# Patient Record
Sex: Male | Born: 1935 | Race: White | Hispanic: No
Health system: Southern US, Community
[De-identification: ages and names within clinical notes are randomized; demographics above are authoritative.]

---

## 2004-08-15 ENCOUNTER — Other Ambulatory Visit: Payer: Self-pay

## 2004-08-25 ENCOUNTER — Other Ambulatory Visit: Payer: Self-pay

## 2004-09-27 ENCOUNTER — Encounter: Payer: Self-pay | Admitting: Internal Medicine

## 2005-12-02 ENCOUNTER — Ambulatory Visit: Payer: Self-pay | Admitting: Gastroenterology

## 2006-07-24 ENCOUNTER — Inpatient Hospital Stay: Payer: Self-pay | Admitting: Internal Medicine

## 2006-07-24 ENCOUNTER — Other Ambulatory Visit: Payer: Self-pay

## 2006-09-17 ENCOUNTER — Ambulatory Visit: Payer: Self-pay | Admitting: Internal Medicine

## 2006-09-29 ENCOUNTER — Ambulatory Visit: Payer: Self-pay | Admitting: Internal Medicine

## 2009-05-16 ENCOUNTER — Ambulatory Visit: Payer: Self-pay | Admitting: Internal Medicine

## 2010-08-14 ENCOUNTER — Ambulatory Visit: Payer: Self-pay | Admitting: Gastroenterology

## 2012-01-07 ENCOUNTER — Ambulatory Visit: Payer: Self-pay | Admitting: Internal Medicine

## 2012-01-20 ENCOUNTER — Ambulatory Visit: Payer: Self-pay | Admitting: Internal Medicine

## 2012-01-29 ENCOUNTER — Ambulatory Visit: Payer: Self-pay | Admitting: Internal Medicine

## 2012-02-02 ENCOUNTER — Ambulatory Visit: Payer: Self-pay | Admitting: Internal Medicine

## 2012-02-26 ENCOUNTER — Ambulatory Visit: Payer: Self-pay | Admitting: Internal Medicine

## 2012-02-29 ENCOUNTER — Ambulatory Visit: Payer: Self-pay | Admitting: Gastroenterology

## 2012-02-29 ENCOUNTER — Emergency Department: Payer: Self-pay | Admitting: *Deleted

## 2012-02-29 LAB — CBC
HCT: 37.5 % — ABNORMAL LOW (ref 40.0–52.0)
HGB: 12.2 g/dL — ABNORMAL LOW (ref 13.0–18.0)
MCHC: 32.4 g/dL (ref 32.0–36.0)
MCV: 91 fL (ref 80–100)
Platelet: 199 10*3/uL (ref 150–440)
RBC: 4.14 10*6/uL — ABNORMAL LOW (ref 4.40–5.90)
RDW: 16.8 % — ABNORMAL HIGH (ref 11.5–14.5)
WBC: 4.8 10*3/uL (ref 3.8–10.6)

## 2012-02-29 LAB — COMPREHENSIVE METABOLIC PANEL
Albumin: 3.3 g/dL — ABNORMAL LOW (ref 3.4–5.0)
Alkaline Phosphatase: 78 U/L (ref 50–136)
BUN: 11 mg/dL (ref 7–18)
Bilirubin,Total: 0.4 mg/dL (ref 0.2–1.0)
Calcium, Total: 8.8 mg/dL (ref 8.5–10.1)
Creatinine: 1.11 mg/dL (ref 0.60–1.30)
Glucose: 118 mg/dL — ABNORMAL HIGH (ref 65–99)
Osmolality: 278 (ref 275–301)
Potassium: 4 mmol/L (ref 3.5–5.1)
SGPT (ALT): 18 U/L
Sodium: 139 mmol/L (ref 136–145)
Total Protein: 6.6 g/dL (ref 6.4–8.2)

## 2012-02-29 LAB — TROPONIN I: Troponin-I: <0.02 ng/mL

## 2012-02-29 LAB — CK TOTAL AND CKMB (NOT AT ARMC): CK-MB: 1.8 ng/mL (ref 0.5–3.6)

## 2012-03-02 ENCOUNTER — Encounter: Payer: Self-pay | Admitting: Gastroenterology

## 2012-03-12 IMAGING — CR DG CHEST 2V
1 series · 3 of 3 positions shown · non-contrast
Comparison: none

REASON FOR EXAM: sob, cough
COMMENTS:

[Series 1: w chest pa · 0.14mm/px · 3 of 3 slices shown]
[im 1/3]
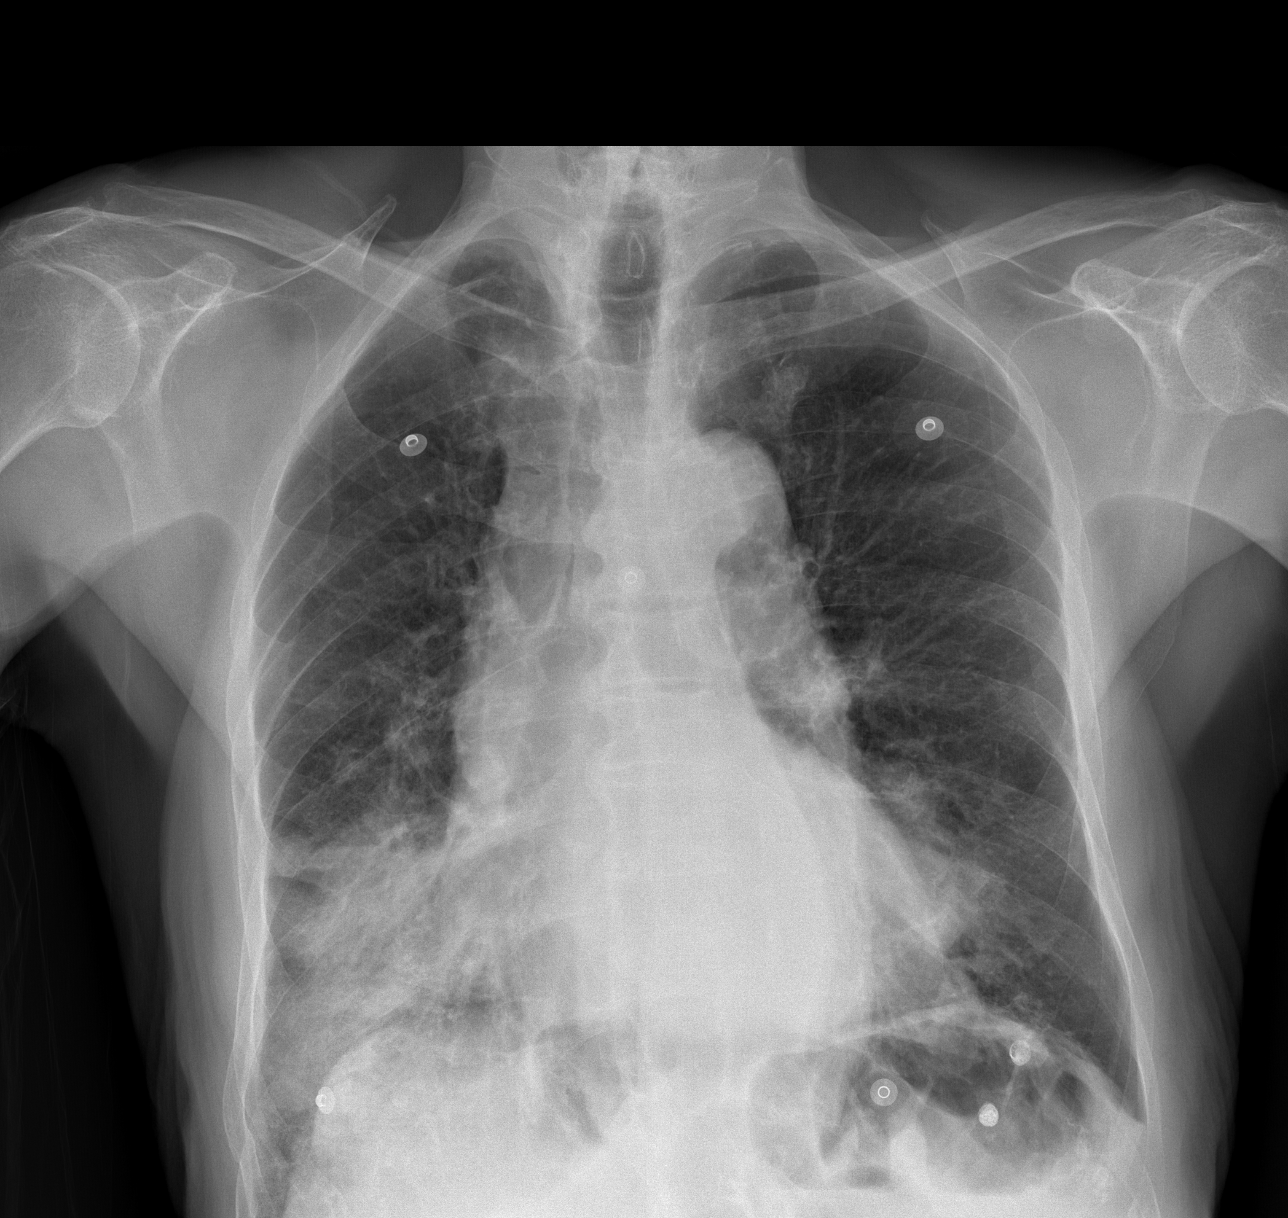
[im 2/3]
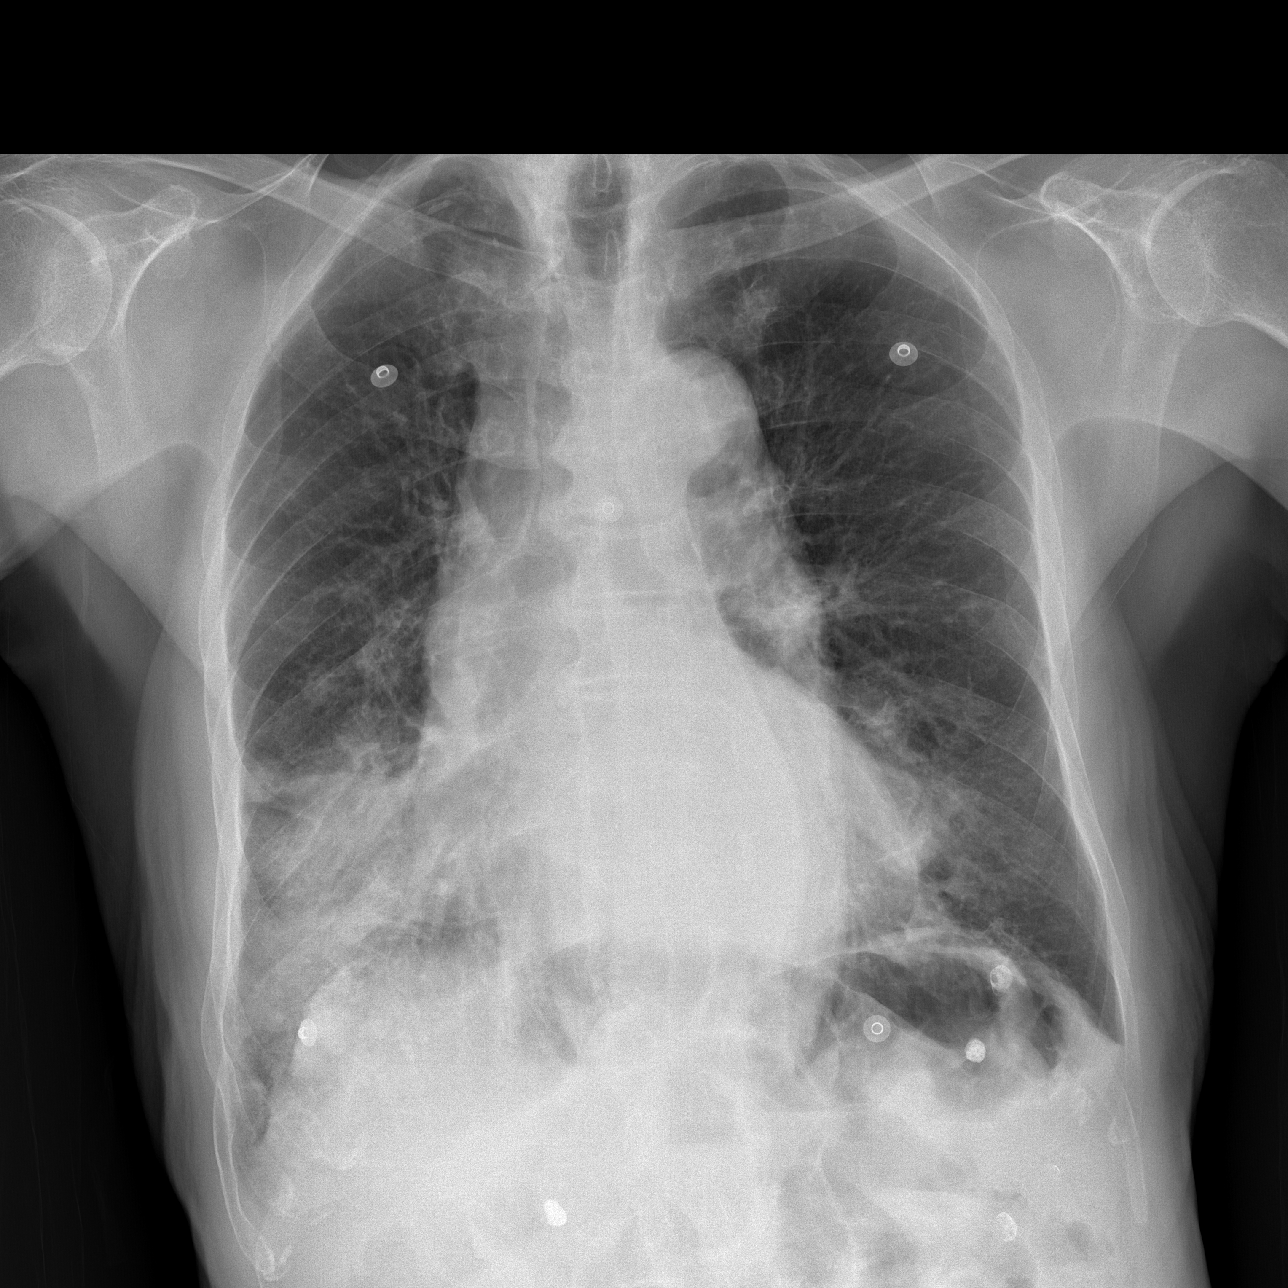
[im 3/3]
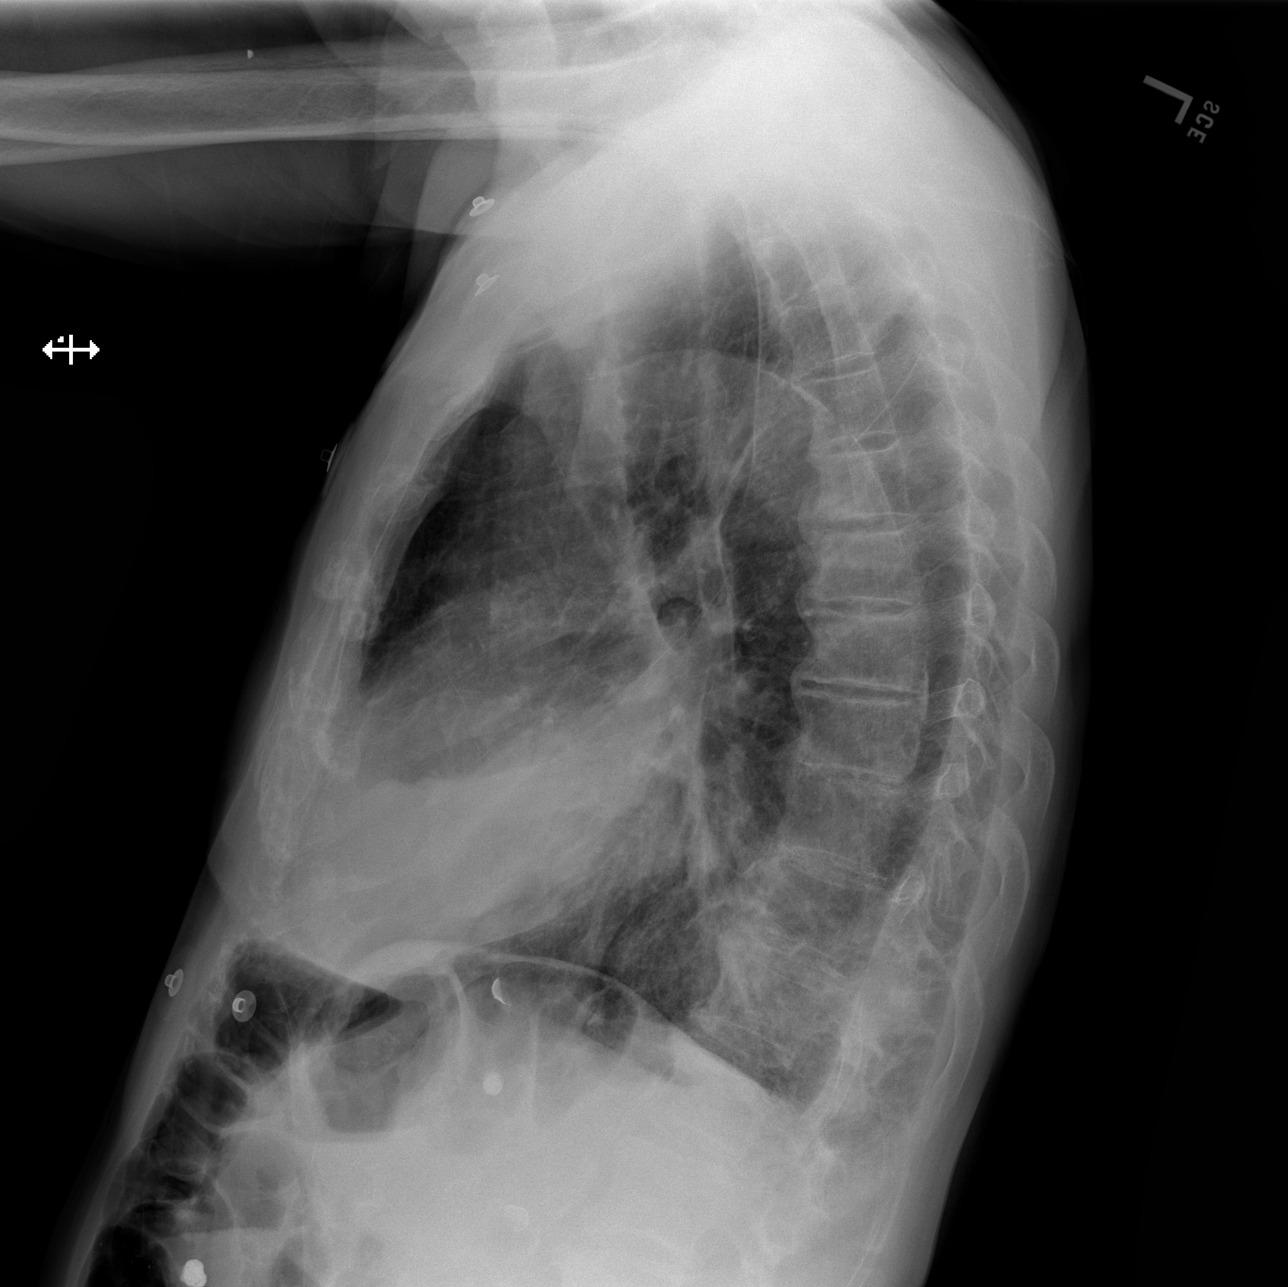

[3 of 3 positions shown; findings below may reference images not displayed]

PROCEDURE:     DXR - DXR CHEST PA (OR AP) AND LATERAL  - February 29, 2012  [DATE]

RESULT:

Comparison is made to a prior study dated 01/20/2012.

There has been increased, ill-defined density within the region of the right
middle lobe with a consolidative component. Increased density also is
identified within the right lower lobe. There is mild prominence of the
interstitial markings. The cardiac silhouette is enlarged indicative of
cardiomegaly. The aorta is tortuous and ectatic. The visualized bony
skeleton is unremarkable.
IMPRESSION: Increased right middle lobe and right lower lobe infiltrates
with a consolidative component in the right middle lobe. Surveillance
evaluation status post appropriate therapeutic regiment is recommended. When
correlated with the previous study, an etiology such as chemical
pneumonitis, e.g. aspiration is a diagnostic consideration.

## 2012-03-14 ENCOUNTER — Inpatient Hospital Stay: Payer: Self-pay | Admitting: Internal Medicine

## 2012-03-14 LAB — TROPONIN I: Troponin-I: <0.02 ng/mL

## 2012-03-14 LAB — CBC
HGB: 11.9 g/dL — ABNORMAL LOW (ref 13.0–18.0)
MCH: 29.8 pg (ref 26.0–34.0)
MCV: 91 fL (ref 80–100)
Platelet: 205 10*3/uL (ref 150–440)
RBC: 3.98 10*6/uL — ABNORMAL LOW (ref 4.40–5.90)
RDW: 16.8 % — ABNORMAL HIGH (ref 11.5–14.5)

## 2012-03-14 LAB — URINALYSIS, COMPLETE
Bacteria: NONE SEEN
Bilirubin,UR: NEGATIVE
Blood: NEGATIVE
Glucose,UR: NEGATIVE mg/dL (ref 0–75)
Ketone: NEGATIVE
Nitrite: NEGATIVE
Ph: 6 (ref 4.5–8.0)
Specific Gravity: 1.025 (ref 1.003–1.030)
Squamous Epithelial: NONE SEEN

## 2012-03-14 LAB — COMPREHENSIVE METABOLIC PANEL
Anion Gap: 11 (ref 7–16)
BUN: 23 mg/dL — ABNORMAL HIGH (ref 7–18)
Calcium, Total: 8.7 mg/dL (ref 8.5–10.1)
Chloride: 106 mmol/L (ref 98–107)
Co2: 27 mmol/L (ref 21–32)
Creatinine: 1.17 mg/dL (ref 0.60–1.30)
EGFR (African American): >60
EGFR (Non-African Amer.): >60
SGOT(AST): 23 U/L (ref 15–37)
SGPT (ALT): 19 U/L
Total Protein: 6.6 g/dL (ref 6.4–8.2)

## 2012-03-15 LAB — CBC WITH DIFFERENTIAL/PLATELET
Basophil #: 0 10*3/uL (ref 0.0–0.1)
Eosinophil #: 0.3 10*3/uL (ref 0.0–0.7)
Eosinophil %: 5.8 %
HCT: 35.2 % — ABNORMAL LOW (ref 40.0–52.0)
HGB: 11.7 g/dL — ABNORMAL LOW (ref 13.0–18.0)
Lymphocyte %: 11.4 %
MCHC: 33.3 g/dL (ref 32.0–36.0)
Monocyte %: 11.6 %
Neutrophil #: 4.3 10*3/uL (ref 1.4–6.5)
Neutrophil %: 70.9 %
RBC: 3.91 10*6/uL — ABNORMAL LOW (ref 4.40–5.90)
RDW: 16.7 % — ABNORMAL HIGH (ref 11.5–14.5)
WBC: 6 10*3/uL (ref 3.8–10.6)

## 2012-03-15 LAB — BASIC METABOLIC PANEL
Anion Gap: 11 (ref 7–16)
BUN: 21 mg/dL — ABNORMAL HIGH (ref 7–18)
Co2: 26 mmol/L (ref 21–32)
Creatinine: 1.01 mg/dL (ref 0.60–1.30)
EGFR (African American): >60
EGFR (Non-African Amer.): >60
Glucose: 78 mg/dL (ref 65–99)
Potassium: 3.9 mmol/L (ref 3.5–5.1)
Sodium: 145 mmol/L (ref 136–145)

## 2012-03-17 LAB — EXPECTORATED SPUTUM ASSESSMENT W GRAM STAIN, RFLX TO RESP C

## 2012-03-20 LAB — CULTURE, BLOOD (SINGLE)

## 2012-03-28 ENCOUNTER — Ambulatory Visit: Payer: Self-pay | Admitting: Internal Medicine

## 2012-03-28 ENCOUNTER — Ambulatory Visit: Payer: Self-pay | Admitting: Oncology

## 2012-03-28 IMAGING — CR DG CHEST 2V
1 series · 2 of 2 positions shown · non-contrast
Comparison: none

REASON FOR EXAM: pneumonia
COMMENTS:

PROCEDURE:     DXR - DXR CHEST PA (OR AP) AND LATERAL  - March 16, 2012  [DATE]
RESULT:     Comparison: 03/14/2012

[Series 4: w chest pa · 0.14mm/px · 2 of 2 slices shown]
[im 1/2]
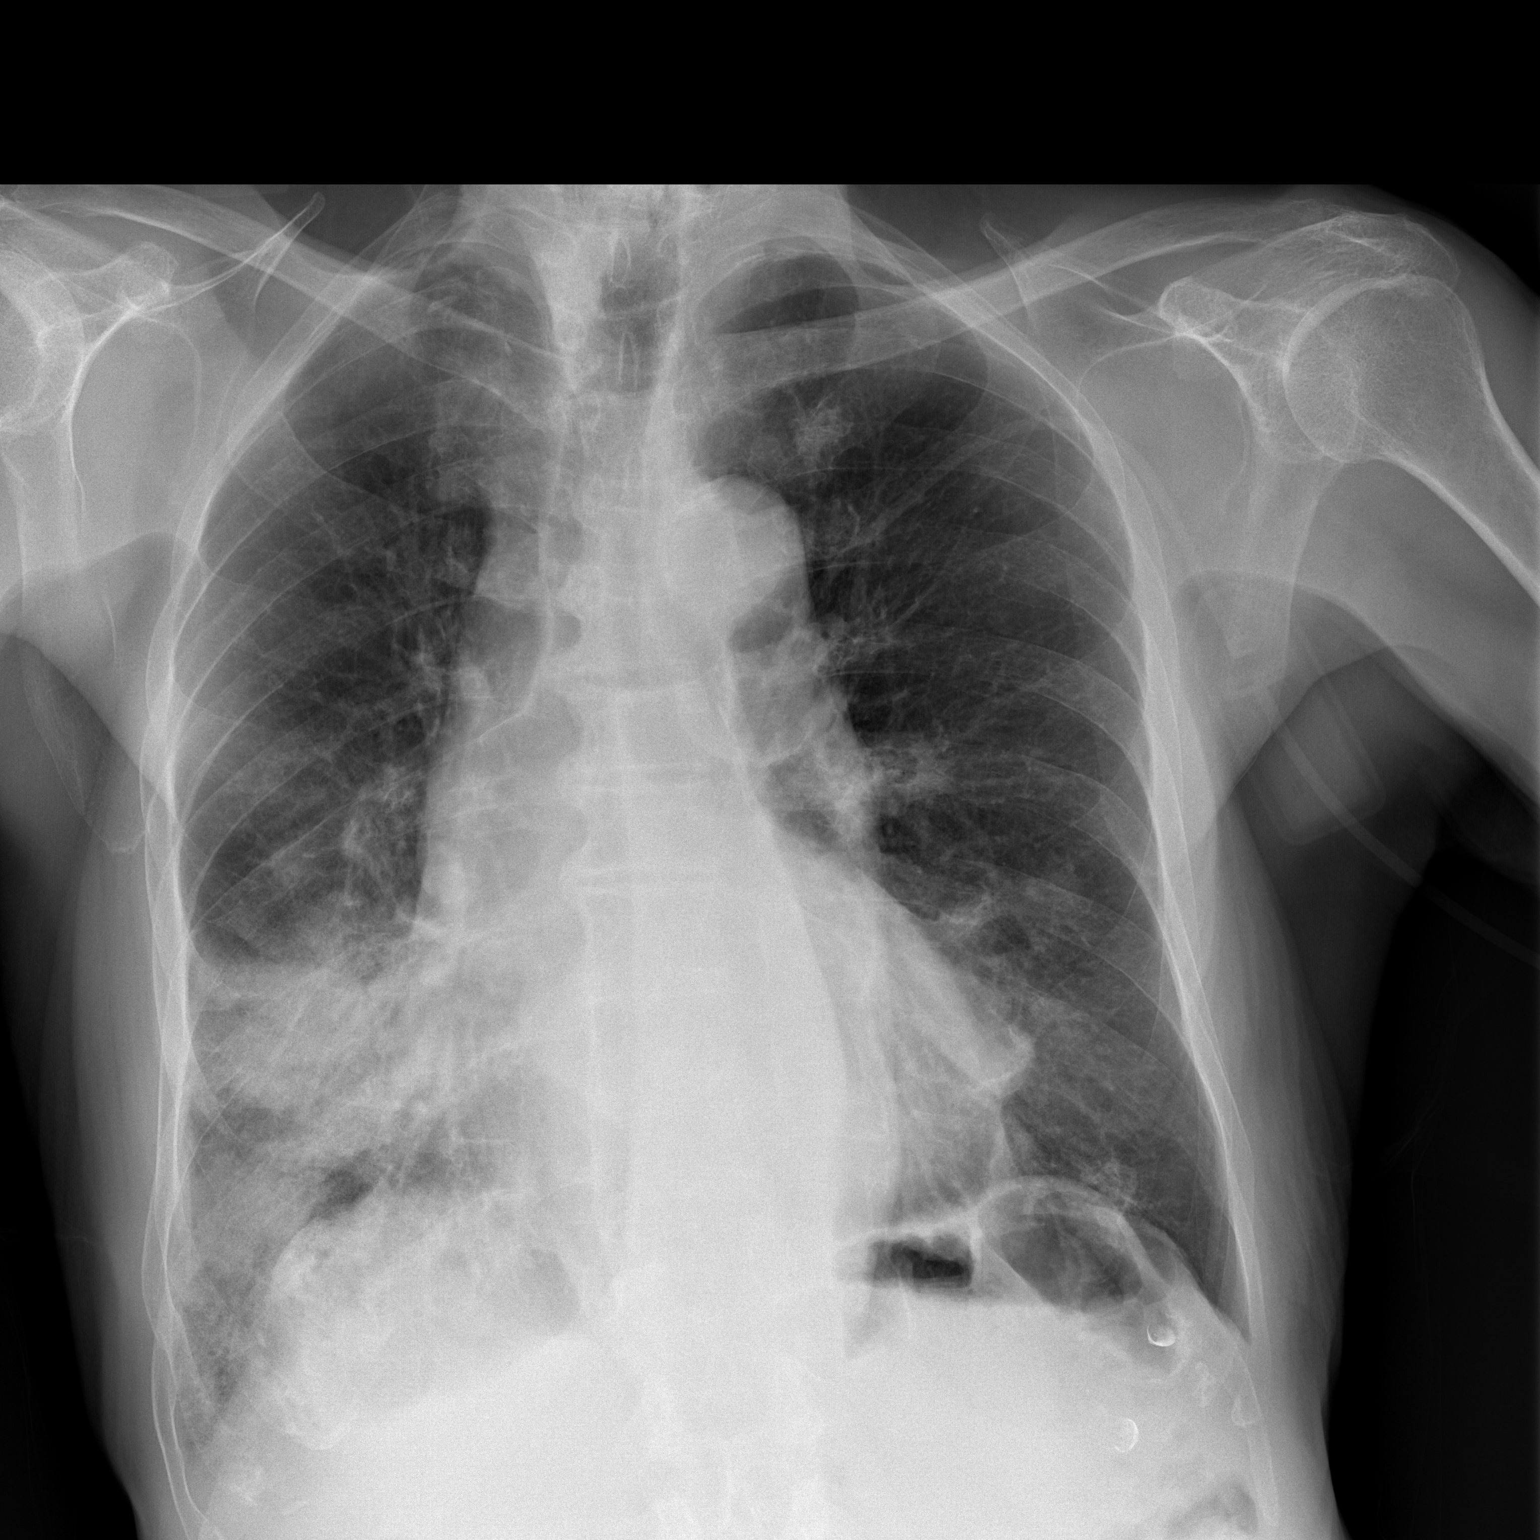
[im 2/2]
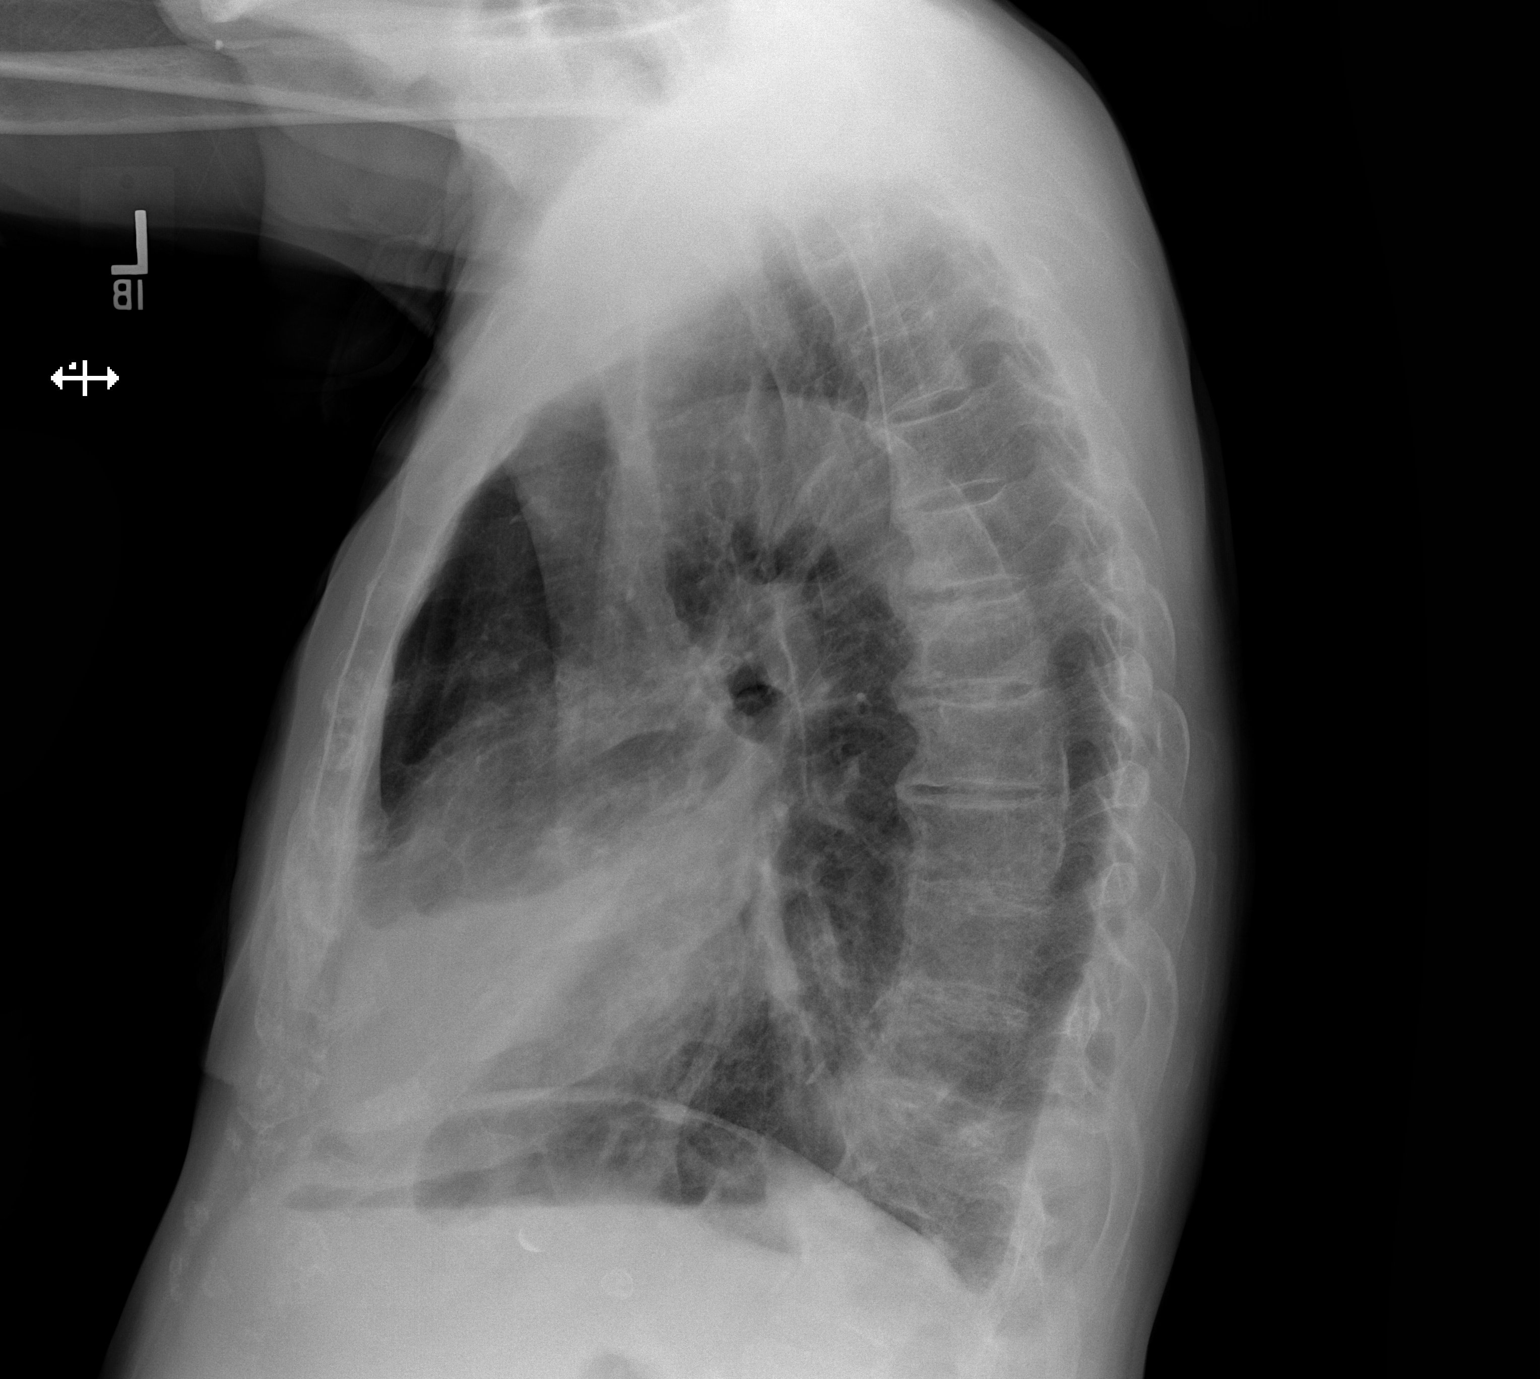

[2 of 2 positions shown; findings below may reference images not displayed]

FINDINGS: PA and lateral chest radiographs are provided. There is right middle lobe
consolidation. There is no pleural effusion or pneumothorax. The heart and
mediastinum are unremarkable.  The osseous structures are unremarkable.
IMPRESSION: Right middle lobe pneumonia. Recommend followup radiography to document
complete resolution following adequate medical therapy. If there is not
complete resolution, then recommend further evaluation with CT of the chest
to exclude underlying pathology.

[REDACTED]

## 2012-04-05 ENCOUNTER — Ambulatory Visit: Payer: Self-pay | Admitting: Internal Medicine

## 2012-04-06 ENCOUNTER — Ambulatory Visit: Payer: Self-pay | Admitting: Gastroenterology

## 2012-04-16 ENCOUNTER — Inpatient Hospital Stay: Payer: Self-pay | Admitting: Internal Medicine

## 2012-04-16 LAB — COMPREHENSIVE METABOLIC PANEL
Albumin: 3 g/dL — ABNORMAL LOW (ref 3.4–5.0)
Alkaline Phosphatase: 103 U/L (ref 50–136)
Anion Gap: 10 (ref 7–16)
BUN: 23 mg/dL — ABNORMAL HIGH (ref 7–18)
Bilirubin,Total: 0.6 mg/dL (ref 0.2–1.0)
Calcium, Total: 8.6 mg/dL (ref 8.5–10.1)
Chloride: 104 mmol/L (ref 98–107)
Co2: 31 mmol/L (ref 21–32)
EGFR (African American): >60
Osmolality: 292 (ref 275–301)
Potassium: 3.8 mmol/L (ref 3.5–5.1)
SGOT(AST): 26 U/L (ref 15–37)
Sodium: 145 mmol/L (ref 136–145)
Total Protein: 6.3 g/dL — ABNORMAL LOW (ref 6.4–8.2)

## 2012-04-16 LAB — CBC
HCT: 39.8 % — ABNORMAL LOW (ref 40.0–52.0)
HGB: 12.9 g/dL — ABNORMAL LOW (ref 13.0–18.0)
MCH: 29.9 pg (ref 26.0–34.0)
MCHC: 32.5 g/dL (ref 32.0–36.0)
MCV: 92 fL (ref 80–100)
Platelet: 169 10*3/uL (ref 150–440)
RBC: 4.32 10*6/uL — ABNORMAL LOW (ref 4.40–5.90)
RDW: 17 % — ABNORMAL HIGH (ref 11.5–14.5)

## 2012-04-16 LAB — CK TOTAL AND CKMB (NOT AT ARMC): CK-MB: 1.6 ng/mL (ref 0.5–3.6)

## 2012-04-16 LAB — PROTIME-INR
INR: 0.9
Prothrombin Time: 12.4 secs (ref 11.5–14.7)

## 2012-04-16 LAB — TROPONIN I: Troponin-I: <0.02 ng/mL

## 2012-04-17 LAB — BASIC METABOLIC PANEL
Anion Gap: 7 (ref 7–16)
BUN: 22 mg/dL — ABNORMAL HIGH (ref 7–18)
Chloride: 105 mmol/L (ref 98–107)
Co2: 29 mmol/L (ref 21–32)
Creatinine: 0.93 mg/dL (ref 0.60–1.30)
EGFR (African American): >60
EGFR (Non-African Amer.): >60
Glucose: 98 mg/dL (ref 65–99)
Potassium: 3.8 mmol/L (ref 3.5–5.1)

## 2012-04-20 LAB — EXPECTORATED SPUTUM ASSESSMENT W GRAM STAIN, RFLX TO RESP C

## 2012-04-22 LAB — CULTURE, BLOOD (SINGLE)

## 2012-04-27 ENCOUNTER — Ambulatory Visit: Payer: Self-pay | Admitting: Oncology

## 2012-04-27 ENCOUNTER — Ambulatory Visit: Payer: Self-pay | Admitting: Internal Medicine

## 2012-04-27 DEATH — deceased

## 2012-04-28 IMAGING — CR DG CHEST 2V
1 series · 2 of 2 positions shown · non-contrast
Comparison: none

REASON FOR EXAM: sob, recent pneumonia with admission
COMMENTS:

PROCEDURE:     DXR - DXR CHEST PA (OR AP) AND LATERAL  - April 16, 2012  [DATE]
RESULT:     Comparison is made to a prior study dated 04/05/2012.

[Series 1: w chest pa · 0.14mm/px · 2 of 2 slices shown]
[im 1/2]
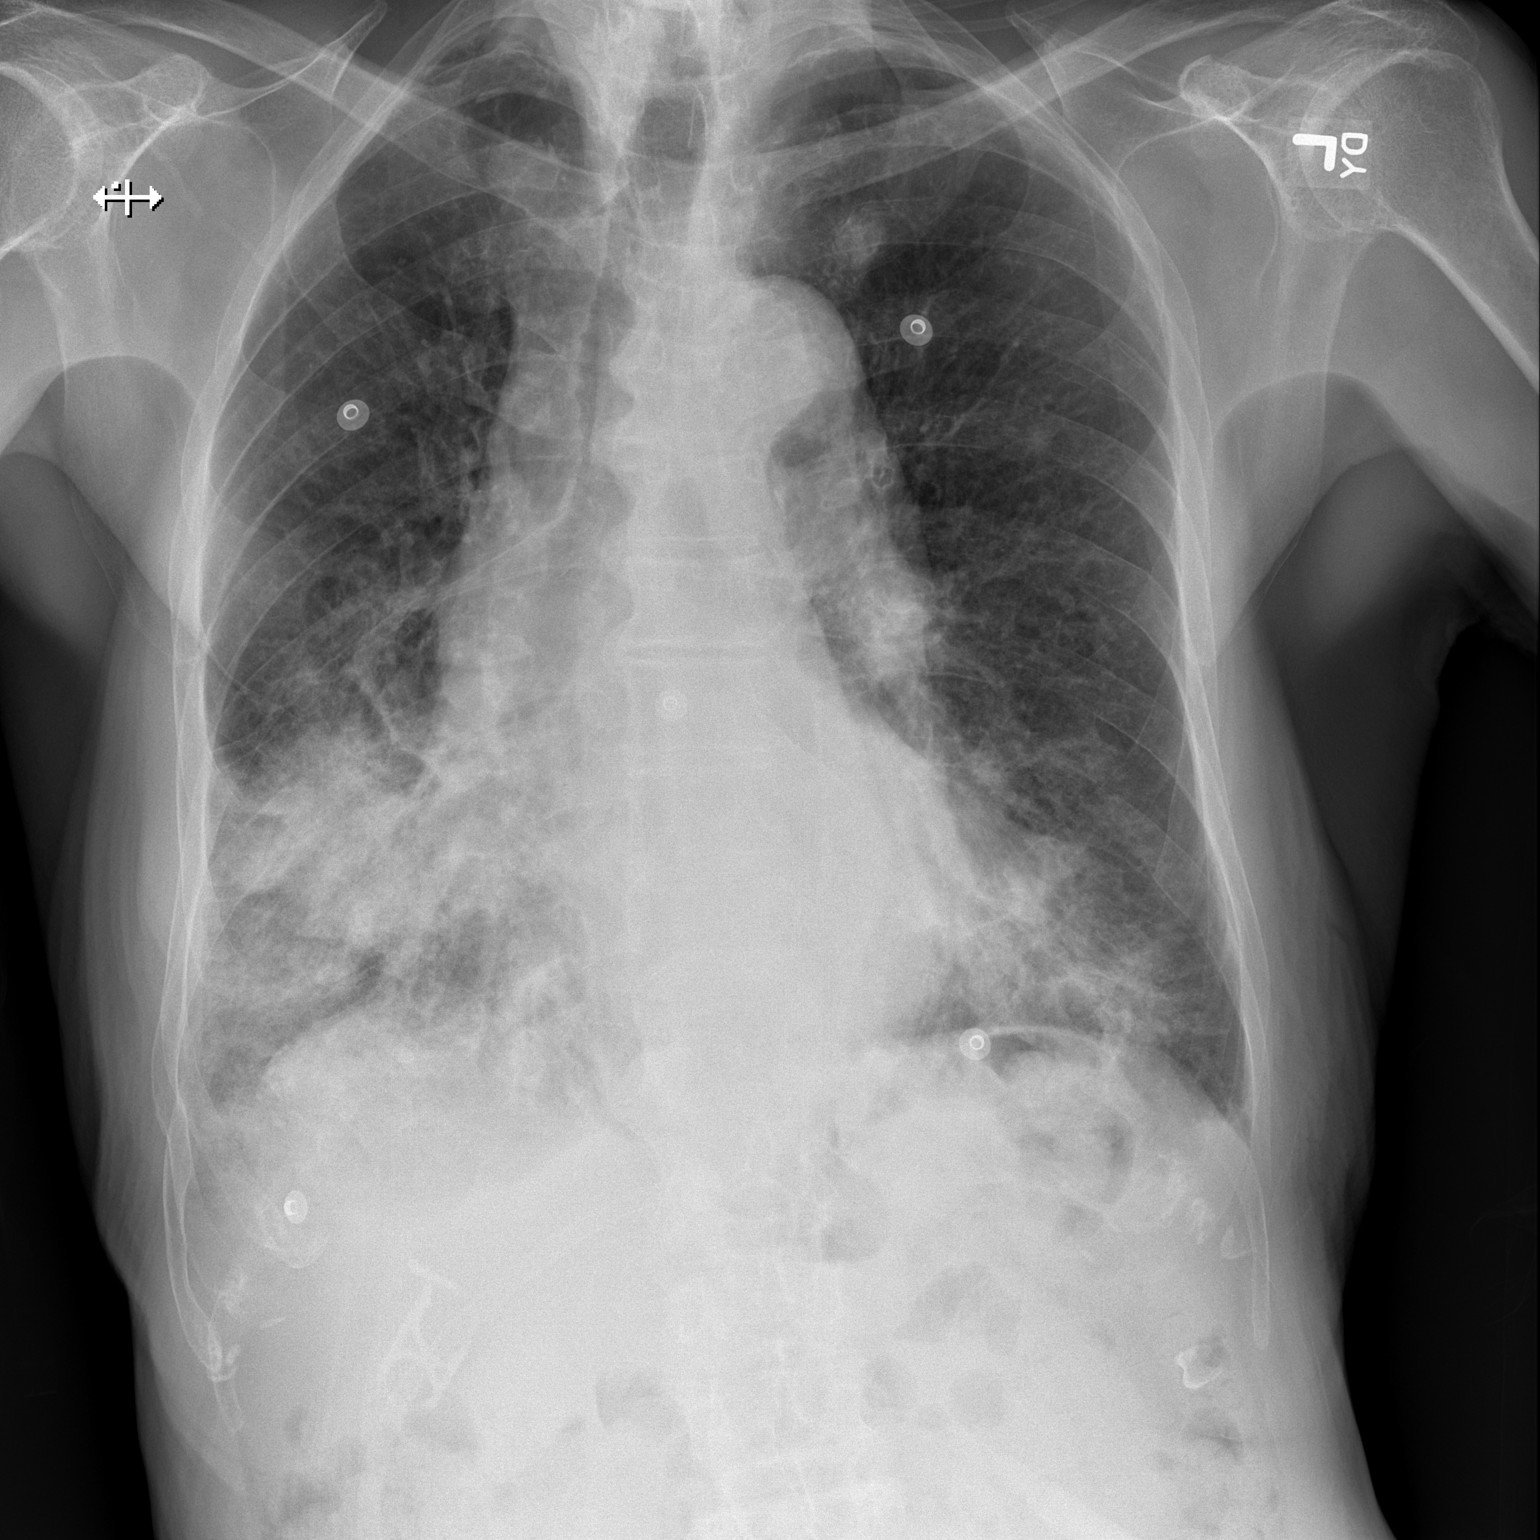
[im 2/2]
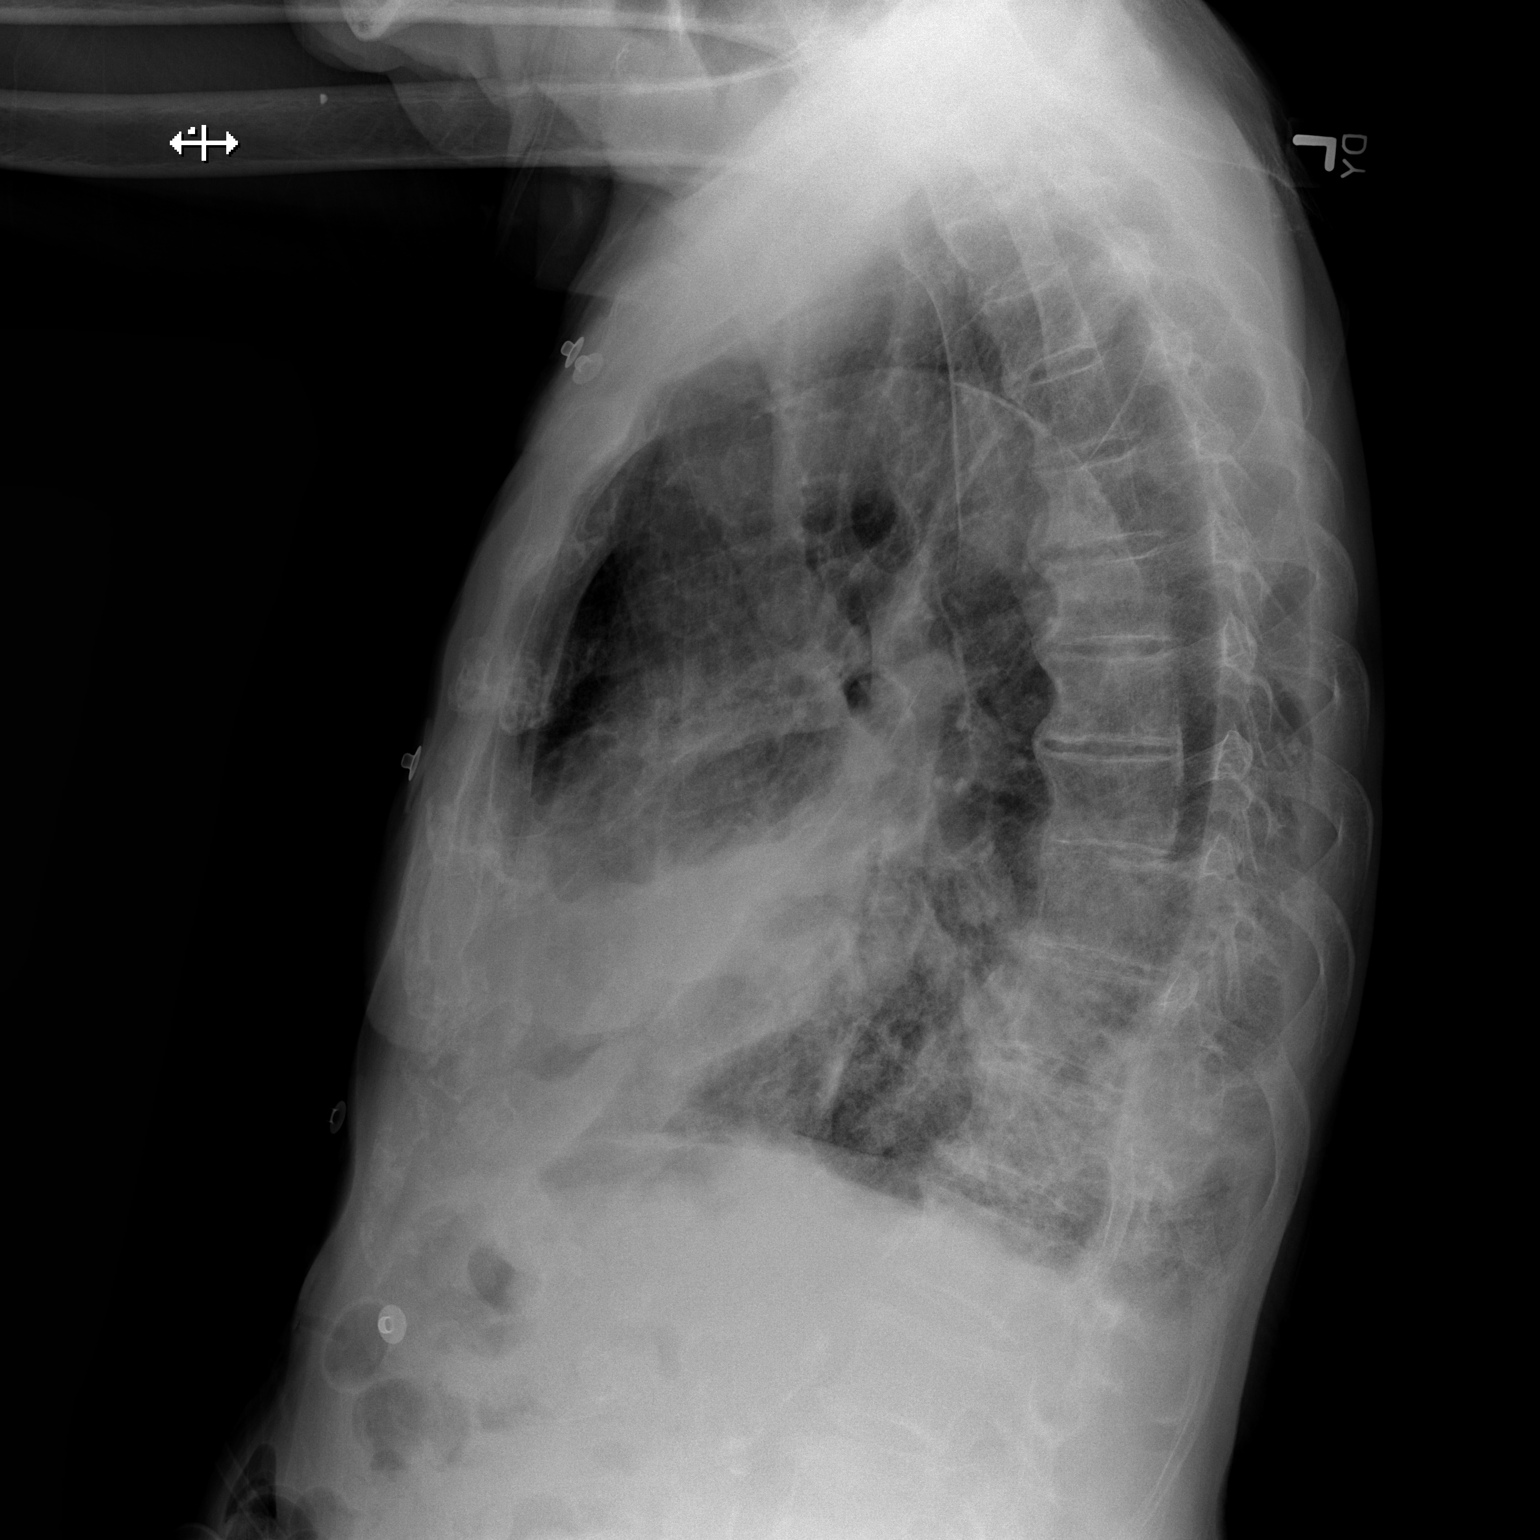

[2 of 2 positions shown; findings below may reference images not displayed]

FINDINGS: Persistent area of consolidative density projects within the right
lung base as well as an infiltrate in the left lung base. There is
thickening of the interstitial markings. The cardiac silhouette is partially
obscured. The aorta is tortuous. The visualized bony skeleton is osteopenic.
IMPRESSION: 1. Increased interstitial infiltrate when compared to previous study. This
may represent a component of pulmonary edema versus a nonedematous
infiltrate.
2. Persistent areas of pneumonitis in the lung bases right greater than
left. Continued surveillance evaluation recommended.

## 2012-05-28 ENCOUNTER — Ambulatory Visit: Payer: Self-pay | Admitting: Internal Medicine

## 2015-04-21 NOTE — Consult Note (Signed)
Chief Complaint:   Subjective/Chief Complaint Patient seen. Agree with request for PEG placement due to known, nearly obstructing esophageal mass, persistant cough and recurrent pneumonia. Patient has been on Plavix and therefore PEG placement would have to be delayed till next week. This will also allow him to recover from pneumonia. Will discuss with Dr. Sherryll BurgerShah and make further plans. Thanks.   Electronic Signatures: Lurline DelIftikhar, Dacota Devall (MD)  (Signed 19-Mar-13 22:35)  Authored: Chief Complaint   Last Updated: 19-Mar-13 22:35 by Lurline DelIftikhar, Joseluis Alessio (MD)

## 2015-04-21 NOTE — Consult Note (Signed)
Brief Consult Note: Diagnosis: pneumonia.   Patient was seen by consultant.   Comments: Appreciate consult for 81 y/o caucasian man with history of Barrett's esophagus/ esophageal mass and recurrent pneumonia for PEG tube placement. At this time, patient prefers to have Dr Niel HummerIftikhar place tube, as he already has a relationship with him. I have spoken to Dr Niel HummerIftikhar and he will come see the patient  Electronic Signatures: Keturah BarreLondon, Trayon Krantz H (NP)  (Signed 19-Mar-13 17:10)  Authored: Brief Consult Note   Last Updated: 19-Mar-13 17:10 by Keturah BarreLondon, Kiera Hussey H (NP)

## 2015-04-21 NOTE — H&P (Signed)
PATIENT NAME:  [name], [name] MR#:  [id number] DATE OF BIRTH:  12/31/1919  DATE OF ADMISSION:  04/16/2012  CHIEF COMPLAINT: Cough and hypoxia.   HISTORY OF PRESENT ILLNESS: Mr. [name] is a 81 year old white male with a history of Barrett's esophagus, esophageal mass biopsied in 2011 with inconclusive biopsy who presents with persistent productive cough which has been treated multiple times over the last three months with rounds of antibiotics with incomplete resolution. The patient underwent a G-tube placement one week ago for progressively enlarging esophageal mass which has been causing aspiration but has not used the G-tube since it was placed. He was treated recently for the third time with antibiotics by his pulmonologist/PCP, Dr. Freda MunroSaadat Khan, and states that with each round of antibiotics he improves but symptoms never completely resolve and then recur with more severity several days after stopping the antibiotics. Currently he is coughing up sputum which is not at all purulent appearing. He underwent a chest CT in February which was concerning for persistent patchy densities in the right middle lobe and left lower lobe, left upper lobe and left perihilar region with hilar lymphadenopathy and thoracic esophageal distention. Dr. Welton FlakesKhan had discussed the need for bronchoscopy with patient as an outpatient, however, the patient became more symptomatic and is being admitted today for treatment.   PRIMARY CARE DOCTOR: Freda MunroSaadat Khan, MD   PAST MEDICAL HISTORY:  1. Barrett's esophagus and esophageal mass biopsied in August 2011 with adenomatous changes and no malignancy seen. He has had repeat endoscopies which have showed soft enlarged villous mass in the distal esophagus.  2. Status post G-tube placement one week ago.  3. History of recurrent aspiration by recent barium swallow study.  4. Recurrent pneumonia since January 1st.   5. Hypertension.   PAST SURGICAL HISTORY:  1. Right lower extremity  surgery secondary to fracture.  2. History of hernia repair in 2008.  3. History of G-tube placement in April 2013.  ALLERGIES: Sulfa makes him sick. He cannot quantify or clarify his symptoms.   LAST HOSPITALIZATION: Two and a half weeks ago for pneumonia.   FAMILY HISTORY: Noncontributory at this point given his age.   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 He smoked a pipe and cigars for years but quit over 20 years ago. He has been widowed since 2002. 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 In the last several years he has traveled extensively with his lady friend all over the [place]. He was active up until his onset of symptoms in January. In fact, in November he was building his own carport which was delayed by his current symptoms. He does live alone.   REVIEW OF SYSTEMS: Positive for fatigue, weakness, and 12 pound weight loss in the last three months. No changes in vision. No history of glaucoma or cataracts. No history of seasonal rhinitis, epistaxis, or postnasal drip. Positive recurrent cough productive of white frothy sputum. No wheezing or hemoptysis. He does note some dyspnea with exertion. He denies chest pain, orthopnea, edema, and arrhythmias. No history of palpitations or syncope. No history of nausea, vomiting, or diarrhea. He has had some mild abdominal discomfort since the G-tube was placed. He has no recent changes in bowel habits. He has had decreased appetite. He is on a dysphagia diet. He has no history of anemia, easy bruising or bleeding. No history of rashes or changes  in skin. No history of chronic neck, back, shoulder, knee or hip pain. No history of numbness or dysarthria. He does have a history of weakness due to weight loss. No history of anxiety, insomnia, depression, or nervousness.   PHYSICAL EXAMINATION:   GENERAL: This is an elderly male who is thin  and looks malnutrition.    ER ADMISSION VITAL SIGNS: Temperature 97.7, pulse 94 and regular, respirations 20, blood pressure 109/59, pulse oximetry 90% on room air.   HEENT: Pupils are equal, round, and reactive to light. Extraocular movements are intact. Sclerae are nonicteric. Oropharynx is benign.   NECK: Supple without lymphadenopathy, JVD, thyromegaly, or carotid bruits.   LUNGS: Clear anteriorly and posteriorly with no egophony, wheezing, or rhonchi.   CARDIOVASCULAR: Regular rate and rhythm. No murmurs, rubs, or gallops.   ABDOMEN: G-tube is in place. There is no surrounding erythema. Bowel sounds are normal. There is no tenderness to palpation.   SKIN: Skin is warm and dry without rashes or lesions.   MUSCULOSKELETAL: Grossly nonfocal with intact strength in all four extremities.   LYMPH: There is no cervical, axillary, inguinal, or supraclavicular lymphadenopathy.   NEUROLOGICAL: Grossly nonfocal. He is alert and oriented to person, place, and time.   ADMISSION LABORATORY DATA: Sodium 145, potassium 3.8, chloride 104, bicarb 31, BUN 23, creatinine 0.99, glucose 85, white count 9.9, hemoglobin 12.9, platelets 169. LFTs are normal except albumin is low at 3.0. PT 12.4. CK 57. MB 1.6. Troponin-I less than 0.02.   12-lead EKG is normal sinus rhythm.  CT of the chest again in February 2013 shows right middle lobe, right lower lobe patchy densities and right hilar lymphadenopathy.   Barium swallow also done last month shows concentric mass in the distal esophagus with aspiration present.   ASSESSMENT AND PLAN:  1. Acute respiratory failure secondary to recurrent pneumonia. Will admit for IV antibiotics and Pulmonary consult with bronchoscopy to determine the cause for recurrent pneumonias. He does have a history of aspiration pneumonia so will cover with empiric antimicrobials for aspiration pneumonias. He is not wheezing so will avoid steroids at this time and use cough  suppressants.  2. Esophageal mass, unclear whether this is Barrett's, whether this has progressed to adenocarcinoma. Biopsy done in 2011 showed adenomatous changes. He has had a G-tube placed given the progression of this mass and the resulting aspiration it has caused. Palliative Care did see this patient during last admission for outlining goals of care. We will have them see him again.     3. Dysphagia secondary to esophageal mass. Continue dysphagia diet.   ____________________________ Duncan Dull, MD tt:drc D: 04/16/2012 20:55:27 ET T: 04/17/2012 08:59:45 ET JOB#: [id number]  cc: Duncan Dull, MD, <Dictator> Yevonne Pax, MD Duncan Dull MD ELECTRONICALLY SIGNED 05/05/2012 18:47

## 2015-04-21 NOTE — Consult Note (Signed)
History of Present Illness:   Reason for Consult Esophageal mass, mediastinal lymphadenopathy.    HPI   Patient is a 81 year old male with a history of an esophageal mass that was biopsied in 2000 mother with inconclusive results.  More recently patient has had several rounds of her current pneumonia with incomplete resolution.  He has had progressive difficulty eating and had a G-tube placed approximately one week ago.  Patient is readmitted to the hospital worsening cough and hypoxia.  Patient is lethargic making review of systems difficult.  PFSH:   Additional Past Medical and Surgical History Past medical history: Barrett's esophagus with esophageal mass.  No malignancy seen.  Recurrent aspiration, recurrent pneumonia, hypertension.  Past surgical history:  G-tube placement, right lower extremity fracture, hernia repair.  Family history: Negative and noncontributory.  Social history: Previous alcohol and tobacco none for greater than 20 years.   Review of Systems:   Performance Status (ECOG) 3    Review of Systems   As per HPI. Otherwise, 10 point system review was negative.   NURSING NOTES: **Vital Signs.:   22-Apr-13 14:00    Vital Signs Type: Routine    Temperature Temperature (F): 98.7    Celsius: 37    Temperature Source: axillary    Pulse Pulse: 67    Pulse source: per Dinamap    Respirations Respirations: 22    Systolic BP Systolic BP: 161    Diastolic BP (mmHg) Diastolic BP (mmHg): 71    Mean BP: 85    BP Source: Dinamap    Pulse Ox % Pulse Ox %: 93    Pulse Ox Activity Level: At rest    Oxygen Delivery: Nasal Cannula; 4.5L   Physical Exam:   Physical Exam General: Thin, ill-appearing Eyes: Pink conjunctiva, anicteric sclera. HEENT: Normocephalic, moist mucous membranes, clear oropharnyx. Lungs: Clear to auscultation bilaterally. Heart: Regular rate and rhythm. No rubs, murmurs, or gallops. Abdomen: Soft, normoactive bowel sounds.  G-tube  noted. Musculoskeletal: No edema, cyanosis, or clubbing. Neuro: Lethargic. Skin: No rashes or petechiae noted. Lymphatics: No cervical, calvicular, axillary or inguinal LAD.    Sulfa drugs: GI Distress    Plavix 75 mg oral tablet: 1 tab(s) orally once a day, Active, 0, None   ranitidine 150 mg oral capsule: 1 cap(s) orally once a day, Active, 0, None   terazosin 10 mg oral capsule: 1 cap(s) orally 2 times a day, Active, 0, None   latanoprost 0.005% ophthalmic solution: 1 drop(s) to each affected eye once a day (at bedtime), Active, 0, None   Vitamin B-12 1000 mcg oral tablet: 1 tab(s) orally once a day, Active, 0, None   vitamin E 400 intl units oral capsule: 1 cap(s) orally once a day, Active, 0, None   Calcium 600+D 600 mg-200 intl units oral tablet: 1 tab(s) orally 2 times a day, Active, 0, None   chromium picolinate 200 mcg oral tablet: 1 tab(s) orally once a day, Active, 0, None   Vitamin B-100 oral tablet: 1 tab(s) orally once a day, Active, 0, None   Glucosamine & Chondroitin with MSM oral tablet: 1 tab(s) orally 2 times a day, Active, 0, None  Routine Chem:  21-Apr-13 03:04    Glucose, Serum 98   BUN 22   Creatinine (comp) 0.93   Sodium, Serum 141   Potassium, Serum 3.8   Chloride, Serum 105   CO2, Serum 29   Calcium (Total), Serum 8.2   Osmolality (calc) 285   eGFR (African American) >  60   eGFR (Non-African American) >60   Anion Gap 7   Assessment and Plan:  Impression:   Esophageal mass with lymphadenopathy.  Plan:   1.  Esophageal mass/lymphadenopathy: Highly suspicious for underlying malignancy.  Given patient's advanced age and worsening medical condition, if a diagnosis were obtained, treatment would likely be more detrimental.  Patient's son-in-law reports he is going to have a bronchoscopy in the near future which may offer a diagnosis.  If patient and family wishes to continue to pursue, can order a PET scan to assess for the extent of disease,  although this can be positive infection as well.  Agree with palliative care input that hospice home is appropriate. consult, call with questions.  Electronic Signatures: Delight Hoh (MD)  (Signed 22-Apr-13 17:44)  Authored: HISTORY OF PRESENT ILLNESS, PFSH, ROS, NURSING NOTES, PE, ALLERGIES, HOME MEDICATIONS, LABS, ASSESSMENT AND PLAN   Last Updated: 22-Apr-13 17:44 by Delight Hoh (MD)

## 2015-04-21 NOTE — Consult Note (Signed)
Chief Complaint:   Subjective/Chief Complaint soke to family at length and spoke palliative care. He may have had a cva overnight as there is significant facial droop and he is more confused. I also looked at the Ct scan which shows progressions of the nodules as well   VITAL SIGNS/ANCILLARY NOTES: **Vital Signs.:   22-Apr-13 14:00   Vital Signs Type Routine   Temperature Temperature (F) 98.7   Celsius 37   Temperature Source axillary   Pulse Pulse 67   Pulse source per Dinamap   Respirations Respirations 22   Systolic BP Systolic BP 409   Diastolic BP (mmHg) Diastolic BP (mmHg) 71   Mean BP 85   BP Source Dinamap   Pulse Ox % Pulse Ox % 93   Pulse Ox Activity Level  At rest   Oxygen Delivery Nasal Cannula; 4.5L  *Intake and Output.:   Shift 22-Apr-13 15:00   Grand Totals Intake:  120 Output:  125    Net:  -5 24 Hr.:  -5   Oral Intake      In:  120   Urine ml     Out:  125   Length of Stay Totals Intake:  610 Output:  1125    Net:  -515   Brief Assessment:   Cardiac Regular  -- LE edema  --Gallop    Respiratory normal resp effort  rhonchi  crackles    Gastrointestinal details normal Soft  Nontender  Bowel sounds normal   Routine Chem:  21-Apr-13 03:04    Glucose, Serum 98   BUN 22   Creatinine (comp) 0.93   Sodium, Serum 141   Potassium, Serum 3.8   Chloride, Serum 105   CO2, Serum 29   Calcium (Total), Serum 8.2   Osmolality (calc) 285   eGFR (African American) >60   eGFR (Non-African American) >60   Anion Gap 7   Radiology Results: CT:    21-Apr-13 15:55, CT Chest With Contrast   CT Chest With Contrast    REASON FOR EXAM:    persistant pneumonia  COMMENTS:       PROCEDURE: CT  - CT CHEST WITH CONTRAST  - Apr 17 2012  3:55PM     RESULT:     Comparison is made to a prior study dated 02/02/2012.    TECHNIQUE: Helical 3 mm sections were obtained from the thoracic inlet   through the lung bases status post intravenous administration of 85 mm of    Isovue-370.    FINDINGS:  Evaluation of the mediastinum and hilar regions and structures   demonstrates multichamber cardiac enlargement. There is prominence ofthe     aorta. A hiatal hernia is identified. The aortic arch measures 3.43 cm in   maximal transverse diameter. These findings appear stable when compared   to the previous study. There is no evidence of focal aortic aneurysm or   dissection. Multichamber cardiac enlargement is identified.    The lung parenchyma demonstrates multifocal areas of consolidative   density throughout both lungs with greatest confluence in the lung bases,   right greater than left. An area of nodular density projects within the   posterior aspect of the right lung apex measuring 2 x 2 cm on image 19 of   the lung windows. This finding as well as the diffuse consolidative areas   has increased in distribution and conspicuity as well as size. There is   now near complete confluence within the base of the right  lower lobe.   There is diffuse thickening of the interstitial markings. There has also   been interval development of pulmonary nodular densities throughout both   lungs and these appear to have a micro-nodular appearance as well as     nodules ranging in size from 5 to 1 cm. Small, bilateral effusions versus   pleural thickening is appreciated.    The visualized upper abdominal viscera demonstrate no gross abnormalities.    IMPRESSION:      1.  Interval development of mediastinal adenopathy with increased size of   the right hilar adenopathy versus a nonmobile soft tissue mass. There is   also increased conspicuity in distribution as well as confluence of the   consolidative densities throughout both lungs as well as increased   numbers and conspicuity of the pulmonary nodules. These findings are   concerning for neoplastic disease and etiologies such as lymphoma. A   pulmonary primary such as bronchoalveolar carcinoma are also of    diagnosticconsideration. Metastatic disease from non-pulmonary primary     cannot be excluded, if clinically warranted. An underlying component of   infection is also of diagnostic consideration.  2.  Hiatal hernia which is stable.  3.  Cardiomegaly.  4.  Trace, bilateral effusions.  5.  Enlarged precarinal lymph nodes are identified as well as enlarged   nodes in the AP window. These have developed in the interim. The   previously right hilar adenopathy has increased in size and now has the   appearance of a nodal mass or a nonnodal soft tissue mass measuring 4.11   x 2.78 cm.     Thank you for this opportunity to contribute to the care of your patient.         Verified By: Mikki Santee, M.D., MD   Assessment/Plan:  Assessment/Plan:   Assessment 12 yow male with likely lung malignancy who has a significant decline since yesterday. After extensive discussion with the family we are all in agreement the best option is comfort care and palliative care has been ordered with the hope that he may be able to go to hospice   Electronic Signatures: Allyne Gee (MD)  (Signed 22-Apr-13 14:17)  Authored: Chief Complaint, VITAL SIGNS/ANCILLARY NOTES, Brief Assessment, Lab Results, Radiology Results, Assessment/Plan   Last Updated: 22-Apr-13 14:17 by Allyne Gee (MD)

## 2015-04-21 NOTE — Discharge Summary (Signed)
PATIENT NAME:  Samuel KurtzGEDDIS, [name] MR#:  [id number] DATE OF BIRTH:  [birth year]  DATE OF ADMISSION:  04/16/2012 DATE OF DISCHARGE:  04/18/2012  PRIMARY CARE PHYSICIAN: Dr. Freda MunroSaadat Khan  FINAL DIAGNOSES:  1. Acute respiratory failure.  2. Repeated aspiration pneumonia. Computed tomography scan concerning for malignancy.  3. Altered mental status and left facial droop, possible acute cerebrovascular accident.  4. Gastroesophageal reflux disease.  5. Benign prostatic hypertrophy.  6. Dysphagia with esophageal mass status post gastric tube placement.   REASON FOR ADMISSION: Patient was discharged to the hospice home on 04/18/2012, admitted 04/16/2012 with cough and hypoxia.   HISTORY OF PRESENT ILLNESS: 81 year old man with history of Barrett's esophagus, esophageal mass with inconclusive biopsy treated multiple times over the last few months with antibiotics with incomplete resolution. He underwent a G-tube placement one week ago. He was admitted with acute respiratory failure with recurrent pneumonia. He was started on initially IV Levaquin and switched over to IV clindamycin.   CONSULTS DURING THE HOSPITAL STAY:  1. Dr. Mechele CollinElliott, gastroenterology. 2. Dr. Freda MunroSaadat Khan, pulmonary. 3. Palliative care.   LABORATORY, DIAGNOSTIC AND RADIOLOGICAL DATA: EKG showed nonspecific ST-Sawyer wave changes, 105 beats per minute, undetermined rhythm. Blood culture negative. Troponin negative. Glucose 85, BUN 23, creatinine 0.99, sodium 145, potassium 3.8, chloride 104, CO2 31, calcium 8.6. Liver function tests normal range. Albumin low at 3.0. White blood cell count 9.9, hemoglobin and hematocrit 12.9 and 39.8, platelet count 169. Chest x-ray showed increased interstitial infiltrate, increased compared to previous study. CT scan of the chest with contrast showed interval development of mediastinal adenopathy increased size of the right hilar adenopathy versus a nonmobile soft tissue mass. Increased conspicuity in  distribution as well as confluence, increased numbers and conspicuity of the pulmonary nodules concerning for neoplastic disease such as lymphoma or a primary pulmonary bronchial alveolar carcinoma versus metastatic disease.   HOSPITAL COURSE PER PROBLEM LIST:  1. For the acute respiratory failure, patient was placed on oxygen throughout the entire hospitalization, now on 4.5 liters, saturating 92%.  2. For the patient's repeated aspiration pneumonia, CT scan was concerning for malignancy. Dr. Welton FlakesKhan spoke with the family about prognosis. Palliative care did see the patient and was switched to hospice home. Patient was transferred to the hospice home on 04/22.   3. For the patient's altered mental status and left facial droop, possible acute cerebrovascular accident, the patient's Plavix was held for possible bronchoscopy. There was a change in condition on the late morning of 04/22. Family decided to go with comfort care. Patient was discharged to the hospice home with diet as tolerated, activity as tolerated, continuous oxygen.   DISCHARGE MEDICATIONS:  1. Roxanol 20 mg/mL 0.25 to 0.5 mL p.o. sublingual every 1 to 2 hours p.r.n. pain, dyspnea.  2. Lorazepam 0.5 to 1 mg p.o. sublingual every 2 to 4 hours p.r.n. agitation, anxiety.  3. Ranitidine 150 mg daily. 4. Terazosin 10 mg twice a day.  5. Latanoprost 0.005% ophthalmic solution one drop each eye daily.  6. Scopolamine patch one patch every three days for secretions.   CODE STATUS: Patient is a DO NOT RESUSCITATE.   TIME SPENT ON PATIENT CARE TODAY: 35 minutes.   ____________________________ Herschell Dimesichard J. Renae GlossWieting, MD rjw:cms D: 04/18/2012 17:31:05 ET Sawyer: 22-Jun-2012 10:07:59 ET JOB#: [id number]  cc: Herschell Dimesichard J. Renae GlossWieting, MD, <Dictator> Yevonne PaxSaadat A. Khan, MD Salley ScarletICHARD J Cherree Conerly MD ELECTRONICALLY SIGNED 04/21/2012 17:29

## 2015-04-21 NOTE — Discharge Summary (Signed)
PATIENT NAME:  [name], [name] MR#:  [id number] DATE OF BIRTH:  September 09, 1920  DATE OF ADMISSION:  03/14/2012 DATE OF DISCHARGE:  03/17/2012  DISCHARGE DIAGNOSES:  1. Right middle lobe pneumonia on antibiotic and steroids, improving. 2. Anemia of chronic disease, stable hemodynamics.  3. Chronic weight loss, will require PEG tube as an outpatient, scheduled by Dr. Niel HummerIftikhar on this coming Tuesday.  4. Zenker's diverticulum. Speech therapy recommends ENT evaluation, which is also scheduled as an outpatient.   SECONDARY DIAGNOSES:  1. Esophageal cancer.   2. Hypertension.   CONSULTATIONS:  1. GI, Dr. Niel HummerIftikhar. 2. Pulmonary, Dr. Freda MunroSaadat Khan. 3. Palliative care, Dr. Harvie JuniorPhifer.  4. Speech therapy.   PROCEDURES/RADIOLOGY:  1. Chest x-ray on 03/16/2012 showed right middle lobe pneumonia.  2. Chest x-ray on 03/14/2012 showed right lung base pneumonia progressed from previous study.   MAJOR LABORATORY PANEL: Urinalysis on admission was negative. Sputum culture was within normal limits. Blood cultures times two were negative. Urine Legionella antigen was negative.   HISTORY AND SHORT HOSPITAL COURSE: The patient is a 81 year old male with above-mentioned medical problems who was admitted for right-sided pneumonia. Please see Dr. Orson SlickFirozvi's dictated History and Physical for further details. The patient was started on vancomycin and Zosyn, was evaluated by speech therapy who recommended outpatient ENT evaluation for possible Zenker's diverticulum. The patient was also evaluated by pulmonary, Dr. Freda MunroSaadat Khan, who agreed with ongoing management.  Palliative care consult was obtained and the patient's CODE STATUS was changed to LIMITED CODE. The patient did agree with getting PEG tube placement, which was scheduled by Dr. Niel HummerIftikhar on 03/22/2012 as an outpatient as the patient was on Plavix while in the hospital. It had been stopped 48 hours before his discharge. He is being discharged home on 03/17/2012 in very  stable condition as he wanted to go home. The patient and family were agreeable with the discharge planning. The patient  ambulated in the hallway without any difficulty, and oxygen saturation stayed more than 92%.  VITAL SIGNS: On the date of discharge his vital signs were as follows: Temperature 97, heart rate 82 per minute, respirations 18 per minute, blood pressure 141/62 mmHg. He was saturating 94 to 96 percent on room air.   PERTINENT PHYSICAL EXAMINATION:  CARDIOVASCULAR: S1, S2 normal. No murmurs, rubs, or gallop. LUNGS: Clear to auscultation bilaterally. No wheezing, rales, rhonchi, or crepitation.  ABDOMEN: Soft, benign. NEUROLOGIC: Nonfocal examination. All other physical examination remained at baseline.   DISCHARGE MEDICATIONS:  1. Ranitidine 150 mg p.o. daily.  2. Latanoprost 0.005% one drop to each eye at bedtime.  3. Terazosin 10 mg p.o. at bedtime. 4. Augmentin 875 mg p.o. b.i.d. for three days.  5. Prednisone 60 mg p.o. daily, taper by 20 mg daily until finished.   DISCHARGE DIET: Low sodium, mechanical soft with thickened liquids, nectar thick.   DISCHARGE ACTIVITY: As tolerated.   DISCHARGE INSTRUCTIONS AND FOLLOWUP:  1. The patient was instructed to stop Plavix and he will see Dr. Niel HummerIftikhar on 03/26 for possible PEG tube placement.  2. He will need follow up with Dr. Bud Facereighton Vaught on 03/22/2012 as scheduled for evaluation of Zenker's diverticulum.  3. He will need followup with Dr. Freda MunroSaadat Khan in 2 to 3 weeks.       TOTAL TIME DISCHARGING THIS PATIENT: 45 minutes.    ____________________________ Ellamae SiaVipul S. Sherryll BurgerShah, MD vss:bjt D: 03/18/2012 14:46:08 ET Sawyer: 03/18/2012 15:27:47 ET JOB#: [id number]  cc: Lam Mccubbins S. Sherryll BurgerShah, MD, <Dictator> Yevonne PaxSaadat A. Khan, MD  Lurline Del, MD Kyung Rudd, MD Ellamae Sia [facility] MD ELECTRONICALLY SIGNED 03/18/2012 17:00

## 2015-04-21 NOTE — Consult Note (Signed)
I was called about need for evaluation for his feeding tube - PEG- that was put in a week ago and has not been used yet.  I asked the nurse to flush the PEG and it flushed well.  All you need is a dietician consult to recommend feedings and instruction of patient and pertinent family members for his tube feedings.  He is still able to swallow very liquid food but expresses that he "just doesn't feel like eating".  I explained that the tube feeding can supplement him when he feel that way.  For now will start on bolus feed of 1/2 can with water flush while await diet consult.  Electronic Signatures: Scot JunElliott, Stedman [place] T (MD)  (Signed on 21-Apr-13 08:26)  Authored  Last Updated: 21-Apr-13 08:26 by Scot JunElliott, Dshawn Mcnay T (MD)

## 2015-04-21 NOTE — H&P (Signed)
PATIENT NAME:  [name], [name] MR#:  [id number] DATE OF BIRTH:  1920/06/04  DATE OF ADMISSION:  03/14/2012  REFERRING PHYSICIAN: Dr. Sharma Covert PRIMARY CARE PHYSICIAN: Dr. Freda Munro  GASTROENTEROLOGIST: Dr. Niel Hummer  REASON FOR ADMISSION: Persistent pneumonia, shortness of breath, failed outpatient antibiotics x3.   HISTORY OF PRESENT ILLNESS: This is a pleasant 81 year old white male with past medical history of hypertension, esophageal mass who since January has been having recurrent pneumonia. After the first of January he said he had shortness of breath. He saw Dr. Freda Munro and was put on a Z-Pak. Symptoms then cleared up. Then on 02/17 he developed pneumonia again. He was put on Cipro. Every time he is on antibiotics his symptoms clear up. As soon as he gets often them several days later he gets another pneumonia. His last one was in March. He completed clindamycin on 03/10/2012 and on 03/12/2012 developed cough, productive phlegm, clear in color, dyspnea on exertion. No fever but drops in temperature. No nausea, vomiting, diarrhea. No chest pain. He has had shortness of breath, very extreme dyspnea on exertion. Patient had chest x-ray which showed right-sided pneumonia. He was given vancomycin in the ER and Zosyn. We are asked to admit the patient for recurrent aspiration pneumonia.   PAST MEDICAL HISTORY:  1. Esophageal cancer. 2. Hypertension.   PAST SURGICAL HISTORY: None.   HOME MEDICATIONS:  1. Plavix 75 mg daily.  2. Terazosin 10 mg b.i.d.  3. Ranitidine 150 mg daily.  4. Latanoprost ophthalmic solution one drop in each eye at bedtime.  5. Glucosamine chondroitin.  6. Multivitamin. 7. B12 1000 mcg monthly.  8. Vitamin E with psyllium 50 mcg. 9. Chromium 200 mcg. 10. B 100. 11. C 1000.  12. Calcium plus vitamin D.   DRUG ALLERGIES: Sulfa.   FAMILY HISTORY: He is not sure but his mother but died in 13s. Father died in his 76s secondary to alcohol abuse.   SOCIAL HISTORY:  He is widowed with three healthy children. He is originally from [place]. Been living here since the 1950s. Is a Comptroller. He quit smoking pipes in 1977. He lives at home and able to do his activities of daily living.   REVIEW OF SYSTEMS: CONSTITUTIONAL: No fever but has had fatigue, weakness and weight loss, 15 pounds over the last three months. EYES: No blurred vision, double vision, pain, redness, inflammation. He does have glaucoma and wears glasses. ENT: No tinnitus, ear pain, hearing loss, seasonal allergies, epistaxis, discharge. RESPIRATORY: He has cough,  dyspnea and phlegm but no wheezing or hemoptysis. CARDIOVASCULAR: No chest pain, orthopnea, edema, arrhythmias. He has dyspnea on exertion. No palpitations. GASTROINTESTINAL: No nausea, vomiting, diarrhea, abdominal pain, hematemesis, melena, gastroesophageal reflux disease. GENITOURINARY: No dysuria, hematuria, renal calculi, increased frequency, incontinence. GU/MALE: No sores, discharge, prostatitis. ENDOCRINE: No polyuria, nocturia, thyroid problems, increased sweating, heat or cold intolerance. HEME/LYMPH: No anemia, easy bruising, swollen glands. INTEGUMENT: No acne, rash, change in mole, hair, or skin. MUSCULOSKELETAL: No pain in back, shoulder, knee, hip, arthritis, swelling, gout. NEUROLOGIC: No numbness, weakness, dysarthria, epilepsy, tremor, vertigo, ataxia. PSYCH: No anxiety, insomnia, ADD, bipolar, depression.   PHYSICAL EXAMINATION:  VITAL SIGNS: Temperature 97.6, heart rate 68, respiratory rate 18, blood pressure 164/84.   GENERAL: Patient is well developed, well nourished, looks younger than stated age.   HEENT: Pupils equal, reactive to light and accommodation. Dry mucous membranes.   NECK: No JVD. No thyromegaly. No lymphadenopathy. No carotid bruits.   RESPIRATORY: Clear to auscultation  but crackles at the bases. No use of accessory muscles or increased effort.   CARDIOVASCULAR: Regular rate and rhythm. Normal  S1, S2. PMI not lateralized. No lower extremity edema. 2+ dorsalis pedis pulses.   BREASTS: No obvious masses.   ABDOMEN: Soft, nontender, nondistended. Positive bowel sounds.   GENITOURINARY: Deferred.   MUSCULOSKELETAL: Strength 5/5. No clubbing, cyanosis, degenerative joint disease.   SKIN: No rashes, lesions, induration. Warm to touch.   LYMPH: No lymphadenopathy in cervical or supraclavicular area.   NEUROLOGIC: Cranial nerves II through XII are intact. No aphasia. No dysphagia or contractures. He is able to follow commands.   PSYCH: Alert and oriented x3 with good judgment.   LABORATORY, DIAGNOSTIC AND RADIOLOGICAL DATA: EKG shows sinus rhythm with no ST changes. Glucose 82, BUN 23, creatinine 1.17, sodium 144, potassium 4.0, chloride 106, bicarbonate 27, anion gap 11, total protein 6.6, albumin 3.2, total bilirubin 0.4, alkaline phosphatase 76, AST 23, ALT 19, troponin less than 0.2 white count 5.1, hemoglobin 11.9, hematocrit 36.1, platelets 205, MCV 91. Urinalysis shows no bacteria, less than 1 WBC, leukocyte esterase negative. Chest x-ray shows right-sided pneumonia which seems to be persistent from January as well.   ASSESSMENT AND PLAN: This is a 81 year old white male with past medical history of esophageal mass, hypertension, gastroesophageal reflux disease who presents with shortness of breath, cough, has had recurrent pneumonia and failed outpatient antibiotic therapy on three separate occasions.   1. Right-sided pneumonia, failed azithromycin, clindamycin and Cipro as outpatient. May be aspirating due to his esophageal mass. Will panculture. Start the patient on vancomycin and Zosyn. Will put the patient on Solu-Medrol and nebulizers and send off urine Legionella. If no improvement would get echo in addition as this could be just cardiac as source of shortness of breath. We will get pulmonary consult.  2. Anemia of chronic disease. Will monitor.  3. Esophageal mass. Will  continue with ranitidine for his presumed gastroesophageal reflux disease and get speech and language pathology evaluation.  4. Weight loss. Will get speech and language pathology evaluation. 5. Dehydration as evidenced by elevated BUN. Will give IV fluids.  6. Deep vein thrombosis prophylaxis. Maintain with Lovenox.  7. CODE STATUS: FULL CODE.   TOTAL TIME SPENT ON ADMISSION: 55 minutes.   ____________________________ Corie ChiquitoAmir A. [place] DragonFirozvi, MD aaf:cms D: 03/14/2012 23:19:43 ET T: 03/15/2012 06:43:41 ET JOB#: [id number]  cc: Karolee OhsAmir A. [place] DragonFirozvi, MD, <Dictator> Yevonne PaxSaadat A. Khan, MD Karolee OhsAMIR Laverda PageA Rayden Dock MD ELECTRONICALLY SIGNED 03/15/2012 21:23

## 2015-04-21 NOTE — Consult Note (Signed)
I will sign off, consult Dr. Niel HummerIftikhar if GI needed  Electronic Signatures: Scot JunElliott, Pema Thomure T (MD)  (Signed on 22-Apr-13 10:28)  Authored  Last Updated: 22-Apr-13 10:28 by Scot JunElliott, Kavina Cantave T (MD)

## 2015-04-21 NOTE — Consult Note (Signed)
PATIENT NAME:  [name], [name] DATE OF BIRTH:  1920-10-29  DATE OF CONSULTATION:  04/17/2012  REFERRING PHYSICIAN:   CONSULTING PHYSICIAN:  Samuel PaxSaadat A. Alycen Mack, MD  REASON FOR CONSULTATION: Acute respiratory failure.   HISTORY OF PRESENT ILLNESS: This is a 81 year old gentleman who has been seen by me in the office. He has a history of Barrett's esophagus along with esophageal mass. He recently had G-tube placement. The patient has been followed as an outpatient. He has had persistent infiltrates which initially were felt likely to be due to aspiration but of late he has had multiple admissions to the hospital and has been treated with antibiotics and really has not had any improvement of the infiltrates. The patient has been having thin watery type sputum production. The diagnosis of malignancy was also entertained. The patient now is back in the hospital for pneumonia. He has been coughing up sputum which is again same characteristic. The patient is now admitted to the hospital. I received a phone call from the family wanting to talk about doing a bronchoscopy which he initially had not wanted to do.   PAST MEDICAL HISTORY:  1. Barrett's esophagus. 2. Status post G-tube placement.  3. History of pneumonia. 4. Hypertension.   PAST SURGICAL HISTORY:  1. G-tube placement. 2. Hernia repair in 2008.   FAMILY HISTORY: Positive for chronic obstructive pulmonary disease in his wife.   SOCIAL HISTORY: Positive for serving in the [employer]. He has a history of alcohol and tobacco use in the past but has not smoked in over 20 years.   REVIEW OF SYSTEMS: As above in the history of present illness and is unremarkable other than what is noted above.    PHYSICAL EXAMINATION:   GENERAL: At the time that he was seen, he was comfortable.   VITAL SIGNS: Temperature 99.1, pulse 96, respiratory rate 20, blood pressure 128/79, saturations were about 91% on 4 liters.   NECK: Neck supple. No  JVD. No adenopathy. No thyromegaly.   CHEST: Coarse breath sounds, a lot of upper airway secretions.   CARDIOVASCULAR: S1, S2 is normal. Regular rhythm. No gallop or rub.   ABDOMEN: Soft, benign.   EXTREMITIES: Without cyanosis, clubbing, or edema. Pulses equal.   NEUROLOGIC: He was awake, appeared to be comfortable.   SKIN: Without any acute rashes. PEG tube site looked clean.   IMPRESSION: Recurrent pneumonia, possible malignancy underlying along with the possibility of recurring aspiration. I will try to see if we can get him in for a bronchoscopy and discussed it with the family in detail and discussed it with the patient in detail. Will try to get that scheduled this week if he is willing. Will continue with supportive care and monitor. Further recommendations as needed.   PROGNOSIS: Overall prognosis still is quite guarded.   ____________________________ Samuel PaxSaadat A. Janeil Schexnayder, MD sak:drc D: 04/17/2012 13:37:00 ET T: 04/17/2012 14:21:52 ET JOB#: [id number]  cc: Samuel PaxSaadat A. Holton Sidman, MD, <Dictator> Samuel PaxSAADAT A Addasyn Mcbreen MD ELECTRONICALLY SIGNED 05/05/2012 13:27
